# Patient Record
Sex: Female | Born: 1975 | Race: White | Hispanic: No | Marital: Married | Smoking: Never smoker
Health system: Southern US, Community
[De-identification: ages and names within clinical notes are randomized; demographics above are authoritative.]

## PROBLEM LIST (undated history)

## (undated) DIAGNOSIS — Z789 Other specified health status: Secondary | ICD-10-CM

## (undated) DIAGNOSIS — Z8742 Personal history of other diseases of the female genital tract: Secondary | ICD-10-CM

## (undated) DIAGNOSIS — R7611 Nonspecific reaction to tuberculin skin test without active tuberculosis: Secondary | ICD-10-CM

## (undated) HISTORY — DX: Personal history of other diseases of the female genital tract: Z87.42

## (undated) HISTORY — PX: DILATION AND CURETTAGE OF UTERUS: SHX78

## (undated) HISTORY — PX: WISDOM TOOTH EXTRACTION: SHX21

## (undated) HISTORY — PX: AUGMENTATION MAMMAPLASTY: SUR837

---

## 2001-01-20 ENCOUNTER — Other Ambulatory Visit: Admission: RE | Admit: 2001-01-20 | Discharge: 2001-01-20 | Payer: Self-pay | Admitting: Family Medicine

## 2002-01-21 ENCOUNTER — Other Ambulatory Visit: Admission: RE | Admit: 2002-01-21 | Discharge: 2002-01-21 | Payer: Self-pay | Admitting: Family Medicine

## 2004-01-25 ENCOUNTER — Encounter: Admission: RE | Admit: 2004-01-25 | Discharge: 2004-01-25 | Payer: Self-pay | Admitting: Family Medicine

## 2004-04-04 ENCOUNTER — Other Ambulatory Visit: Admission: RE | Admit: 2004-04-04 | Discharge: 2004-04-04 | Payer: Self-pay | Admitting: Obstetrics and Gynecology

## 2004-06-22 ENCOUNTER — Emergency Department (HOSPITAL_COMMUNITY): Admission: EM | Admit: 2004-06-22 | Discharge: 2004-06-23 | Payer: Self-pay | Admitting: Emergency Medicine

## 2005-05-21 ENCOUNTER — Other Ambulatory Visit: Admission: RE | Admit: 2005-05-21 | Discharge: 2005-05-21 | Payer: Self-pay | Admitting: Obstetrics and Gynecology

## 2005-08-24 ENCOUNTER — Ambulatory Visit (HOSPITAL_COMMUNITY): Admission: RE | Admit: 2005-08-24 | Discharge: 2005-08-24 | Payer: Self-pay | Admitting: Obstetrics and Gynecology

## 2006-09-28 ENCOUNTER — Inpatient Hospital Stay (HOSPITAL_COMMUNITY): Admission: AD | Admit: 2006-09-28 | Discharge: 2006-09-28 | Payer: Self-pay | Admitting: Obstetrics and Gynecology

## 2006-12-19 ENCOUNTER — Inpatient Hospital Stay (HOSPITAL_COMMUNITY): Admission: AD | Admit: 2006-12-19 | Discharge: 2006-12-19 | Payer: Self-pay | Admitting: Obstetrics and Gynecology

## 2006-12-20 ENCOUNTER — Encounter (INDEPENDENT_AMBULATORY_CARE_PROVIDER_SITE_OTHER): Payer: Self-pay | Admitting: Obstetrics and Gynecology

## 2006-12-20 ENCOUNTER — Inpatient Hospital Stay (HOSPITAL_COMMUNITY): Admission: AD | Admit: 2006-12-20 | Discharge: 2006-12-22 | Payer: Self-pay | Admitting: Obstetrics and Gynecology

## 2006-12-23 ENCOUNTER — Encounter: Admission: RE | Admit: 2006-12-23 | Discharge: 2007-01-14 | Payer: Self-pay | Admitting: Obstetrics and Gynecology

## 2008-11-06 ENCOUNTER — Inpatient Hospital Stay (HOSPITAL_COMMUNITY): Admission: AD | Admit: 2008-11-06 | Discharge: 2008-11-06 | Payer: Self-pay | Admitting: Obstetrics and Gynecology

## 2009-06-15 ENCOUNTER — Inpatient Hospital Stay (HOSPITAL_COMMUNITY): Admission: AD | Admit: 2009-06-15 | Discharge: 2009-06-15 | Payer: Self-pay | Admitting: Obstetrics and Gynecology

## 2009-06-19 ENCOUNTER — Inpatient Hospital Stay (HOSPITAL_COMMUNITY): Admission: AD | Admit: 2009-06-19 | Discharge: 2009-06-21 | Payer: Self-pay | Admitting: Obstetrics and Gynecology

## 2009-06-20 ENCOUNTER — Encounter (INDEPENDENT_AMBULATORY_CARE_PROVIDER_SITE_OTHER): Payer: Self-pay | Admitting: Obstetrics and Gynecology

## 2010-07-23 NOTE — L&D Delivery Note (Signed)
Delivery Note At 2:10 PM a viable female was delivered via  LOA with 4 pushes  APGAR 9 9  weight pending Placenta status: delivered spontaneously no complications Cord:  with the following complications: none.  Cord pH: not obtained  Anesthesia:  epidural Episiotomy: none Lacerations: none Suture Repair: none Est. Blood Loss (mL): 300  Mom to postpartum.  Baby to nursery-stable.  Samantha Owens L 06/11/2011, 2:25 PM

## 2010-08-13 ENCOUNTER — Encounter: Payer: Self-pay | Admitting: Gynecology

## 2010-10-25 LAB — CBC
HCT: 36.2 % (ref 36.0–46.0)
HCT: 41.6 % (ref 36.0–46.0)
Hemoglobin: 12.4 g/dL (ref 12.0–15.0)
Hemoglobin: 14 g/dL (ref 12.0–15.0)
MCHC: 33.7 g/dL (ref 30.0–36.0)
MCHC: 34.2 g/dL (ref 30.0–36.0)
MCV: 93.8 fL (ref 78.0–100.0)
MCV: 94.1 fL (ref 78.0–100.0)
Platelets: 152 10*3/uL (ref 150–400)
Platelets: 183 10*3/uL (ref 150–400)
RBC: 3.86 MIL/uL — ABNORMAL LOW (ref 3.87–5.11)
RBC: 4.42 MIL/uL (ref 3.87–5.11)
RDW: 12.9 % (ref 11.5–15.5)
RDW: 13 % (ref 11.5–15.5)
WBC: 10.7 10*3/uL — ABNORMAL HIGH (ref 4.0–10.5)
WBC: 13.4 10*3/uL — ABNORMAL HIGH (ref 4.0–10.5)

## 2010-10-25 LAB — RPR: RPR Ser Ql: NONREACTIVE

## 2010-11-09 LAB — GC/CHLAMYDIA PROBE AMP, GENITAL
Chlamydia: NEGATIVE
Gonorrhea: NEGATIVE

## 2010-11-09 LAB — ABO/RH: RH Type: POSITIVE

## 2010-11-09 LAB — HIV ANTIBODY (ROUTINE TESTING W REFLEX): HIV: NONREACTIVE

## 2010-11-25 ENCOUNTER — Inpatient Hospital Stay (HOSPITAL_COMMUNITY)
Admission: AD | Admit: 2010-11-25 | Discharge: 2010-11-25 | Disposition: A | Discharge status: Home / Self Care | Payer: PRIVATE HEALTH INSURANCE | Source: Ambulatory Visit | Attending: Obstetrics and Gynecology | Admitting: Obstetrics and Gynecology

## 2010-11-25 DIAGNOSIS — O209 Hemorrhage in early pregnancy, unspecified: Secondary | ICD-10-CM

## 2010-12-05 NOTE — Op Note (Signed)
Samantha Owens, Samantha Owens               ACCOUNT NO.:  [id number]   MEDICAL RECORD NO.:  [id number]          PATIENT TYPE:  INP   LOCATION:  9162                          FACILITY:  [facility]   PHYSICIAN:  Duke Salvia. Marcelle Overlie, M.D.DATE OF BIRTH:  Jan 23, 1976   DATE OF PROCEDURE:  12/20/2006  DATE OF DISCHARGE:                               OPERATIVE REPORT   DELIVERY NOTE:  The patient was in second stage labor with a scalp lead on, had had low  grade fever to 100, was given Tylenol and IV Unasyn was started early in  the pushing phase.  When the vertex was at OA at +3 station, due to some  variable decelerations that started to have a slower recovery, decision  made to proceed with VE assisted delivery which was explained to the  patient and her husband.  The Kiwi was applied.  One traction effort  coordinated with maternal pushing to effect easy delivery of a female.  Apgars were 9 and 9.  Cord pH was pending.  Placenta was delivered  spontaneously intact, sent to pathology.  Pitocin was given at that  point.  A small second-degree laceration and the left superficial  periurethra were repaired with 3-0 Vicryl Rapide. Estimated blood loss  was 300 mL.  Unasyn IV will be continued for several doses postpartum.  Mother and baby doing well at that point.      Richard M. Marcelle Overlie, M.D.  Electronically Signed     [facility]  D:  12/20/2006  T:  12/20/2006  Job:  [id number]

## 2011-01-17 ENCOUNTER — Other Ambulatory Visit (HOSPITAL_COMMUNITY): Payer: Self-pay | Admitting: Obstetrics and Gynecology

## 2011-01-18 ENCOUNTER — Encounter (HOSPITAL_COMMUNITY): Payer: Self-pay

## 2011-01-18 ENCOUNTER — Other Ambulatory Visit (HOSPITAL_COMMUNITY): Payer: Self-pay | Admitting: Obstetrics and Gynecology

## 2011-01-18 ENCOUNTER — Ambulatory Visit (HOSPITAL_COMMUNITY)
Admission: RE | Admit: 2011-01-18 | Discharge: 2011-01-18 | Disposition: A | Discharge status: Home / Self Care | Payer: PRIVATE HEALTH INSURANCE | Source: Ambulatory Visit | Attending: Obstetrics and Gynecology | Admitting: Obstetrics and Gynecology

## 2011-01-18 DIAGNOSIS — B9789 Other viral agents as the cause of diseases classified elsewhere: Secondary | ICD-10-CM | POA: Insufficient documentation

## 2011-01-18 DIAGNOSIS — O09529 Supervision of elderly multigravida, unspecified trimester: Secondary | ICD-10-CM | POA: Insufficient documentation

## 2011-01-18 DIAGNOSIS — O98519 Other viral diseases complicating pregnancy, unspecified trimester: Secondary | ICD-10-CM | POA: Insufficient documentation

## 2011-01-25 ENCOUNTER — Other Ambulatory Visit (HOSPITAL_COMMUNITY): Payer: Self-pay | Admitting: Obstetrics and Gynecology

## 2011-01-25 ENCOUNTER — Ambulatory Visit (HOSPITAL_COMMUNITY)
Admission: RE | Admit: 2011-01-25 | Discharge: 2011-01-25 | Disposition: A | Discharge status: Home / Self Care | Payer: PRIVATE HEALTH INSURANCE | Source: Ambulatory Visit | Attending: Obstetrics and Gynecology | Admitting: Obstetrics and Gynecology

## 2011-01-25 DIAGNOSIS — B9789 Other viral agents as the cause of diseases classified elsewhere: Secondary | ICD-10-CM | POA: Insufficient documentation

## 2011-01-25 DIAGNOSIS — O98519 Other viral diseases complicating pregnancy, unspecified trimester: Secondary | ICD-10-CM | POA: Insufficient documentation

## 2011-01-25 DIAGNOSIS — O09529 Supervision of elderly multigravida, unspecified trimester: Secondary | ICD-10-CM | POA: Insufficient documentation

## 2011-02-01 ENCOUNTER — Other Ambulatory Visit (HOSPITAL_COMMUNITY): Payer: PRIVATE HEALTH INSURANCE

## 2011-02-02 ENCOUNTER — Ambulatory Visit (HOSPITAL_COMMUNITY)
Admission: RE | Admit: 2011-02-02 | Discharge: 2011-02-02 | Disposition: A | Discharge status: Home / Self Care | Payer: PRIVATE HEALTH INSURANCE | Source: Ambulatory Visit | Attending: Obstetrics and Gynecology | Admitting: Obstetrics and Gynecology

## 2011-02-02 ENCOUNTER — Encounter (HOSPITAL_COMMUNITY): Payer: Self-pay

## 2011-02-02 DIAGNOSIS — O09529 Supervision of elderly multigravida, unspecified trimester: Secondary | ICD-10-CM | POA: Insufficient documentation

## 2011-02-02 DIAGNOSIS — O98519 Other viral diseases complicating pregnancy, unspecified trimester: Secondary | ICD-10-CM | POA: Insufficient documentation

## 2011-02-09 ENCOUNTER — Ambulatory Visit (HOSPITAL_COMMUNITY)
Admission: RE | Admit: 2011-02-09 | Discharge: 2011-02-09 | Disposition: A | Discharge status: Home / Self Care | Payer: PRIVATE HEALTH INSURANCE | Source: Ambulatory Visit | Attending: Obstetrics and Gynecology | Admitting: Obstetrics and Gynecology

## 2011-02-09 DIAGNOSIS — O98519 Other viral diseases complicating pregnancy, unspecified trimester: Secondary | ICD-10-CM | POA: Insufficient documentation

## 2011-02-09 DIAGNOSIS — B9789 Other viral agents as the cause of diseases classified elsewhere: Secondary | ICD-10-CM | POA: Insufficient documentation

## 2011-02-09 DIAGNOSIS — O09529 Supervision of elderly multigravida, unspecified trimester: Secondary | ICD-10-CM | POA: Insufficient documentation

## 2011-02-16 ENCOUNTER — Other Ambulatory Visit (HOSPITAL_COMMUNITY): Payer: PRIVATE HEALTH INSURANCE

## 2011-02-16 ENCOUNTER — Ambulatory Visit (HOSPITAL_COMMUNITY)
Admission: RE | Admit: 2011-02-16 | Discharge: 2011-02-16 | Disposition: A | Discharge status: Home / Self Care | Payer: PRIVATE HEALTH INSURANCE | Source: Ambulatory Visit | Attending: Obstetrics and Gynecology | Admitting: Obstetrics and Gynecology

## 2011-02-16 DIAGNOSIS — B9789 Other viral agents as the cause of diseases classified elsewhere: Secondary | ICD-10-CM | POA: Insufficient documentation

## 2011-02-16 DIAGNOSIS — O98519 Other viral diseases complicating pregnancy, unspecified trimester: Secondary | ICD-10-CM | POA: Insufficient documentation

## 2011-02-16 DIAGNOSIS — O09529 Supervision of elderly multigravida, unspecified trimester: Secondary | ICD-10-CM | POA: Insufficient documentation

## 2011-02-22 ENCOUNTER — Ambulatory Visit (HOSPITAL_COMMUNITY)
Admission: RE | Admit: 2011-02-22 | Discharge: 2011-02-22 | Disposition: A | Discharge status: Home / Self Care | Payer: PRIVATE HEALTH INSURANCE | Source: Ambulatory Visit | Attending: Obstetrics and Gynecology | Admitting: Obstetrics and Gynecology

## 2011-02-22 NOTE — Progress Notes (Signed)
Report in [facility]; follow-up as scheduled

## 2011-02-23 ENCOUNTER — Other Ambulatory Visit (HOSPITAL_COMMUNITY): Payer: PRIVATE HEALTH INSURANCE

## 2011-03-02 ENCOUNTER — Ambulatory Visit (HOSPITAL_COMMUNITY)
Admission: RE | Admit: 2011-03-02 | Discharge: 2011-03-02 | Disposition: A | Discharge status: Home / Self Care | Payer: PRIVATE HEALTH INSURANCE | Source: Ambulatory Visit | Attending: Obstetrics and Gynecology | Admitting: Obstetrics and Gynecology

## 2011-03-02 DIAGNOSIS — B9789 Other viral agents as the cause of diseases classified elsewhere: Secondary | ICD-10-CM | POA: Insufficient documentation

## 2011-03-02 DIAGNOSIS — B343 Parvovirus infection, unspecified: Secondary | ICD-10-CM | POA: Insufficient documentation

## 2011-03-02 DIAGNOSIS — O98519 Other viral diseases complicating pregnancy, unspecified trimester: Secondary | ICD-10-CM | POA: Insufficient documentation

## 2011-03-02 NOTE — Progress Notes (Signed)
Ultrasound in [facility].  Follow up U/S scheduled 1 week

## 2011-03-09 ENCOUNTER — Other Ambulatory Visit (HOSPITAL_COMMUNITY): Payer: Self-pay | Admitting: Obstetrics and Gynecology

## 2011-03-09 ENCOUNTER — Ambulatory Visit (HOSPITAL_COMMUNITY)
Admission: RE | Admit: 2011-03-09 | Discharge: 2011-03-09 | Disposition: A | Discharge status: Home / Self Care | Payer: PRIVATE HEALTH INSURANCE | Source: Ambulatory Visit | Attending: Obstetrics and Gynecology | Admitting: Obstetrics and Gynecology

## 2011-03-09 DIAGNOSIS — B9789 Other viral agents as the cause of diseases classified elsewhere: Secondary | ICD-10-CM | POA: Insufficient documentation

## 2011-03-09 DIAGNOSIS — O98519 Other viral diseases complicating pregnancy, unspecified trimester: Secondary | ICD-10-CM | POA: Insufficient documentation

## 2011-03-09 NOTE — Progress Notes (Signed)
Patient seen for ultrasound only appointment today.  Please see [facility] report for details.  

## 2011-03-16 ENCOUNTER — Other Ambulatory Visit (HOSPITAL_COMMUNITY): Payer: Self-pay | Admitting: Obstetrics and Gynecology

## 2011-03-16 ENCOUNTER — Ambulatory Visit (HOSPITAL_COMMUNITY)
Admission: RE | Admit: 2011-03-16 | Discharge: 2011-03-16 | Disposition: A | Discharge status: Home / Self Care | Payer: PRIVATE HEALTH INSURANCE | Source: Ambulatory Visit | Attending: Obstetrics and Gynecology | Admitting: Obstetrics and Gynecology

## 2011-03-16 DIAGNOSIS — B9789 Other viral agents as the cause of diseases classified elsewhere: Secondary | ICD-10-CM | POA: Insufficient documentation

## 2011-03-16 DIAGNOSIS — O98519 Other viral diseases complicating pregnancy, unspecified trimester: Secondary | ICD-10-CM | POA: Insufficient documentation

## 2011-03-19 ENCOUNTER — Ambulatory Visit (HOSPITAL_COMMUNITY)
Admission: RE | Admit: 2011-03-19 | Discharge: 2011-03-19 | Disposition: A | Discharge status: Home / Self Care | Payer: PRIVATE HEALTH INSURANCE | Source: Ambulatory Visit | Attending: Obstetrics and Gynecology | Admitting: Obstetrics and Gynecology

## 2011-03-19 DIAGNOSIS — B9789 Other viral agents as the cause of diseases classified elsewhere: Secondary | ICD-10-CM | POA: Insufficient documentation

## 2011-03-19 DIAGNOSIS — O98519 Other viral diseases complicating pregnancy, unspecified trimester: Secondary | ICD-10-CM | POA: Insufficient documentation

## 2011-03-19 DIAGNOSIS — O09529 Supervision of elderly multigravida, unspecified trimester: Secondary | ICD-10-CM | POA: Insufficient documentation

## 2011-03-21 DIAGNOSIS — Z3480 Encounter for supervision of other normal pregnancy, unspecified trimester: Secondary | ICD-10-CM

## 2011-03-21 DIAGNOSIS — R112 Nausea with vomiting, unspecified: Secondary | ICD-10-CM

## 2011-03-23 ENCOUNTER — Other Ambulatory Visit (HOSPITAL_COMMUNITY): Payer: Self-pay | Admitting: Obstetrics and Gynecology

## 2011-03-23 ENCOUNTER — Ambulatory Visit (HOSPITAL_COMMUNITY)
Admission: RE | Admit: 2011-03-23 | Discharge: 2011-03-23 | Disposition: A | Discharge status: Home / Self Care | Payer: PRIVATE HEALTH INSURANCE | Source: Ambulatory Visit | Attending: Obstetrics and Gynecology | Admitting: Obstetrics and Gynecology

## 2011-03-23 DIAGNOSIS — B9789 Other viral agents as the cause of diseases classified elsewhere: Secondary | ICD-10-CM | POA: Insufficient documentation

## 2011-03-23 DIAGNOSIS — O98519 Other viral diseases complicating pregnancy, unspecified trimester: Secondary | ICD-10-CM | POA: Insufficient documentation

## 2011-03-23 DIAGNOSIS — O09529 Supervision of elderly multigravida, unspecified trimester: Secondary | ICD-10-CM | POA: Insufficient documentation

## 2011-03-23 NOTE — Progress Notes (Signed)
Ultrasound in [facility].  Follow up U/S scheduled; increased MCA MOM

## 2011-03-28 ENCOUNTER — Ambulatory Visit (HOSPITAL_COMMUNITY)
Admission: RE | Admit: 2011-03-28 | Discharge: 2011-03-28 | Disposition: A | Discharge status: Home / Self Care | Payer: PRIVATE HEALTH INSURANCE | Source: Ambulatory Visit | Attending: Obstetrics and Gynecology | Admitting: Obstetrics and Gynecology

## 2011-03-28 DIAGNOSIS — B9789 Other viral agents as the cause of diseases classified elsewhere: Secondary | ICD-10-CM | POA: Insufficient documentation

## 2011-03-28 DIAGNOSIS — O09529 Supervision of elderly multigravida, unspecified trimester: Secondary | ICD-10-CM | POA: Insufficient documentation

## 2011-03-28 DIAGNOSIS — O98519 Other viral diseases complicating pregnancy, unspecified trimester: Secondary | ICD-10-CM | POA: Insufficient documentation

## 2011-03-30 ENCOUNTER — Other Ambulatory Visit (HOSPITAL_COMMUNITY): Payer: Self-pay | Admitting: Obstetrics and Gynecology

## 2011-03-30 DIAGNOSIS — O09529 Supervision of elderly multigravida, unspecified trimester: Secondary | ICD-10-CM

## 2011-03-30 DIAGNOSIS — O98519 Other viral diseases complicating pregnancy, unspecified trimester: Secondary | ICD-10-CM

## 2011-04-03 ENCOUNTER — Ambulatory Visit (HOSPITAL_COMMUNITY)
Admission: RE | Admit: 2011-04-03 | Discharge: 2011-04-03 | Disposition: A | Discharge status: Home / Self Care | Payer: PRIVATE HEALTH INSURANCE | Source: Ambulatory Visit | Attending: Obstetrics and Gynecology | Admitting: Obstetrics and Gynecology

## 2011-04-03 DIAGNOSIS — O09529 Supervision of elderly multigravida, unspecified trimester: Secondary | ICD-10-CM | POA: Insufficient documentation

## 2011-04-03 DIAGNOSIS — O98519 Other viral diseases complicating pregnancy, unspecified trimester: Secondary | ICD-10-CM

## 2011-04-03 DIAGNOSIS — B9789 Other viral agents as the cause of diseases classified elsewhere: Secondary | ICD-10-CM | POA: Insufficient documentation

## 2011-05-16 ENCOUNTER — Inpatient Hospital Stay (HOSPITAL_COMMUNITY)
Admission: AD | Admit: 2011-05-16 | Discharge: 2011-05-17 | Disposition: A | Discharge status: Home / Self Care | Payer: PRIVATE HEALTH INSURANCE | Source: Ambulatory Visit | Attending: Obstetrics and Gynecology | Admitting: Obstetrics and Gynecology

## 2011-05-16 DIAGNOSIS — R109 Unspecified abdominal pain: Secondary | ICD-10-CM | POA: Insufficient documentation

## 2011-05-16 DIAGNOSIS — O212 Late vomiting of pregnancy: Secondary | ICD-10-CM | POA: Insufficient documentation

## 2011-05-16 DIAGNOSIS — R112 Nausea with vomiting, unspecified: Secondary | ICD-10-CM

## 2011-05-16 HISTORY — DX: Other specified health status: Z78.9

## 2011-05-17 ENCOUNTER — Encounter (HOSPITAL_COMMUNITY): Payer: Self-pay | Admitting: *Deleted

## 2011-05-17 MED ORDER — PROMETHAZINE HCL 25 MG/ML IJ SOLN
25.0000 mg | Freq: Once | INTRAMUSCULAR | Status: AC
  Administered 2011-05-17: 25 mg via INTRAVENOUS
  Filled 2011-05-17: qty 1

## 2011-05-17 MED ORDER — LACTATED RINGERS IV BOLUS (SEPSIS)
1000.0000 mL | Freq: Once | INTRAVENOUS | Status: AC
  Administered 2011-05-17: 1000 mL via INTRAVENOUS

## 2011-05-17 MED ORDER — PROMETHAZINE HCL 25 MG PO TABS: 25.0000 mg | ORAL_TABLET | Freq: Four times a day (QID) | ORAL | Status: AC | PRN

## 2011-05-17 NOTE — Progress Notes (Signed)
Abd pain better after threw up

## 2011-05-17 NOTE — ED Notes (Signed)
0145 Pt vomited

## 2011-05-17 NOTE — Progress Notes (Signed)
Written and verbal d/c instructions given and understanding voiced. 

## 2011-05-17 NOTE — ED Provider Notes (Signed)
History   Pt presents today c/o upper abd pain that began earlier tonight. She states when she arrived at the MAU she vomited and her pain resolved. She states she has a child at home with similar sx. She denies lower abd pain, vag dc, bleeding, fever, or any other sx at this time.  Chief Complaint  Patient presents with  . Labor Eval   HPI  OB History    Grav Para Term Preterm Abortions TAB SAB Ect Mult Living   4 2 2  0 1 1 0 0 0 2      Past Medical History  Diagnosis Date  . No pertinent past medical history     Past Surgical History  Procedure Date  . Dilation and curettage of uterus     No family history on file.  History  Substance Use Topics  . Smoking status: Never Smoker   . Smokeless tobacco: Not on file  . Alcohol Use: No    Allergies:  Allergies  Allergen Reactions  . Erythromycin Nausea And Vomiting  . Avocado Other (See Comments)    stomach    Prescriptions prior to admission  Medication Sig Dispense Refill  . Prenatal MV-Min-Fe Fum-FA-DHA (PRENATAL 1 PO) Take by mouth.        . Simethicone (MAALOX ANTI-GAS PO) Take 10 mLs by mouth daily.          Review of Systems  Constitutional: Negative for fever.  Eyes: Negative for blurred vision.  Cardiovascular: Negative for chest pain.  Gastrointestinal: Positive for nausea, vomiting and abdominal pain. Negative for diarrhea, constipation and blood in stool.  Genitourinary: Negative for dysuria, urgency, frequency, hematuria and flank pain.  Neurological: Negative for dizziness and headaches.  Psychiatric/Behavioral: Negative for depression and suicidal ideas.   Physical Exam   Blood pressure 118/81, pulse 90, temperature 97.9 F (36.6 C), temperature source Oral, resp. rate 18, height 5\' 6"  (1.676 m), weight 178 lb (80.74 kg), SpO2 98.00%.  Physical Exam  Nursing note and vitals reviewed. Constitutional: She is oriented to person, place, and time. She appears well-developed and well-nourished. No  distress.  HENT:  Head: Normocephalic and atraumatic.  Eyes: EOM are normal. Pupils are equal, round, and reactive to light.  GI: Soft. She exhibits no distension. There is no tenderness. There is no rebound and no guarding.  Genitourinary: No bleeding around the vagina. No vaginal discharge found.       Cervix Lg/closed.  Neurological: She is alert and oriented to person, place, and time.  Skin: Skin is warm and dry. She is not diaphoretic.  Psychiatric: She has a normal mood and affect. Her behavior is normal. Judgment and thought content normal.    MAU Course  Procedures  NST reactive.  Pt sx resolved following IV hydration and antiemetics.    Assessment and Plan  N&V: discussed with pt at length. Will give Rx for phenergan. She has f/u scheduled. Discussed diet, activity,risks, and precautions. Discussed signs and sx of labor.  Clinton Gallant. Gevork Ayyad III, DrHSc, MPAS, PA-C  05/17/2011, 1:12 AM   Henrietta Hoover, PA 05/17/11 [phone]

## 2011-05-17 NOTE — Progress Notes (Signed)
Pt reports constant pain x 4 hours, vomited once she arrived here she vomited and pain eased some.pt was seen in office today and was 1 cm , denies bleeding , reports good fetal movement. G4P2

## 2011-05-17 NOTE — Progress Notes (Signed)
G4P2  Pain lower abd since 2000. Nauseated. When came to MAU, vomited 5 times in lobby. No diarrhea. 35yo at home started vomiting tonight.

## 2011-05-17 NOTE — ED Notes (Signed)
Henrietta Hoover PA in to see pt. Pt feeling better

## 2011-05-17 NOTE — Discharge Instructions (Signed)
B.R.A.T. Diet    Your doctor has recommended the B.R.A.T. diet for you or your child until the condition improves. This is often used to help control diarrhea and vomiting symptoms. If you or your child can tolerate clear liquids, you may have:  · Bananas.   · Samantha Owens.   · Applesauce.   · Toast (and other simple starches such as crackers, potatoes, noodles).   Be sure to avoid dairy products, meats, and fatty foods until symptoms are better. Fruit juices such as apple, grape, and prune juice can make diarrhea worse. Avoid these. Continue this diet for 2 days or as instructed by your caregiver.  Document Released: 07/09/2005 Document Revised: 03/21/2011 Document Reviewed: 12/26/2006  [employer]® Patient Information ©2012 [employer].

## 2011-05-17 NOTE — ED Notes (Signed)
0145 Henrietta Hoover PA in to see pt

## 2011-06-03 ENCOUNTER — Inpatient Hospital Stay (HOSPITAL_COMMUNITY)
Admission: AD | Admit: 2011-06-03 | Discharge: 2011-06-03 | Disposition: A | Discharge status: Home / Self Care | Payer: PRIVATE HEALTH INSURANCE | Source: Ambulatory Visit | Attending: Obstetrics and Gynecology | Admitting: Obstetrics and Gynecology

## 2011-06-03 ENCOUNTER — Encounter (HOSPITAL_COMMUNITY): Payer: Self-pay | Admitting: *Deleted

## 2011-06-03 DIAGNOSIS — O479 False labor, unspecified: Secondary | ICD-10-CM | POA: Insufficient documentation

## 2011-06-03 DIAGNOSIS — B343 Parvovirus infection, unspecified: Secondary | ICD-10-CM

## 2011-06-03 NOTE — Progress Notes (Signed)
Pt c/o contractions off and on throughout the day; no regular ctx but pt states they are stronger with more pressure in my pelvis.

## 2011-06-07 ENCOUNTER — Telehealth (HOSPITAL_COMMUNITY): Payer: Self-pay | Admitting: *Deleted

## 2011-06-07 ENCOUNTER — Encounter (HOSPITAL_COMMUNITY): Payer: Self-pay | Admitting: *Deleted

## 2011-06-07 NOTE — Telephone Encounter (Signed)
Preadmission screen  

## 2011-06-11 ENCOUNTER — Inpatient Hospital Stay (HOSPITAL_COMMUNITY)
Admission: AD | Admit: 2011-06-11 | Discharge: 2011-06-13 | DRG: 775 | Disposition: A | Discharge status: Home / Self Care | Payer: PRIVATE HEALTH INSURANCE | Source: Ambulatory Visit | Attending: Obstetrics and Gynecology | Admitting: Obstetrics and Gynecology

## 2011-06-11 ENCOUNTER — Inpatient Hospital Stay (HOSPITAL_COMMUNITY): Payer: PRIVATE HEALTH INSURANCE | Admitting: Anesthesiology

## 2011-06-11 ENCOUNTER — Encounter (HOSPITAL_COMMUNITY): Payer: Self-pay | Admitting: *Deleted

## 2011-06-11 ENCOUNTER — Encounter (HOSPITAL_COMMUNITY): Payer: Self-pay | Admitting: Anesthesiology

## 2011-06-11 DIAGNOSIS — O09529 Supervision of elderly multigravida, unspecified trimester: Principal | ICD-10-CM | POA: Diagnosis present

## 2011-06-11 DIAGNOSIS — B343 Parvovirus infection, unspecified: Secondary | ICD-10-CM

## 2011-06-11 HISTORY — DX: Nonspecific reaction to tuberculin skin test without active tuberculosis: R76.11

## 2011-06-11 LAB — CBC
HCT: 41 % (ref 36.0–46.0)
Hemoglobin: 14.1 g/dL (ref 12.0–15.0)
MCH: 31.4 pg (ref 26.0–34.0)
MCHC: 34.4 g/dL (ref 30.0–36.0)
MCV: 91.3 fL (ref 78.0–100.0)
Platelets: 145 K/uL — ABNORMAL LOW (ref 150–400)
RBC: 4.49 MIL/uL (ref 3.87–5.11)
RDW: 13.3 % (ref 11.5–15.5)
WBC: 8.5 K/uL (ref 4.0–10.5)

## 2011-06-11 LAB — RUBELLA SCREEN: Rubella: >500 IU/mL — ABNORMAL HIGH

## 2011-06-11 MED ORDER — FLEET ENEMA 7-19 GM/118ML RE ENEM: 1.0000 | ENEMA | RECTAL | Status: DC | PRN

## 2011-06-11 MED ORDER — MEDROXYPROGESTERONE ACETATE 150 MG/ML IM SUSP: 150.0000 mg | INTRAMUSCULAR | Status: DC | PRN

## 2011-06-11 MED ORDER — BENZOCAINE-MENTHOL 20-0.5 % EX AERO
1.0000 "application " | INHALATION_SPRAY | CUTANEOUS | Status: DC | PRN
  Administered 2011-06-12: 1 via TOPICAL

## 2011-06-11 MED ORDER — DIBUCAINE 1 % RE OINT: 1.0000 "application " | TOPICAL_OINTMENT | RECTAL | Status: DC | PRN

## 2011-06-11 MED ORDER — SIMETHICONE 80 MG PO CHEW: 80.0000 mg | CHEWABLE_TABLET | ORAL | Status: DC | PRN

## 2011-06-11 MED ORDER — ZOLPIDEM TARTRATE 5 MG PO TABS: 5.0000 mg | ORAL_TABLET | Freq: Every evening | ORAL | Status: DC | PRN

## 2011-06-11 MED ORDER — ONDANSETRON HCL 4 MG/2ML IJ SOLN: 4.0000 mg | INTRAMUSCULAR | Status: DC | PRN

## 2011-06-11 MED ORDER — TETANUS-DIPHTH-ACELL PERTUSSIS 5-2.5-18.5 LF-MCG/0.5 IM SUSP: 0.5000 mL | Freq: Once | INTRAMUSCULAR | Status: DC

## 2011-06-11 MED ORDER — ACETAMINOPHEN 325 MG PO TABS: 650.0000 mg | ORAL_TABLET | ORAL | Status: DC | PRN

## 2011-06-11 MED ORDER — LIDOCAINE HCL 1.5 % IJ SOLN
INTRAMUSCULAR | Status: DC | PRN
  Administered 2011-06-11 (×2): 5 mL via EPIDURAL

## 2011-06-11 MED ORDER — PHENYLEPHRINE 40 MCG/ML (10ML) SYRINGE FOR IV PUSH (FOR BLOOD PRESSURE SUPPORT)
80.0000 ug | PREFILLED_SYRINGE | INTRAVENOUS | Status: DC | PRN
  Filled 2011-06-11: qty 5

## 2011-06-11 MED ORDER — CITRIC ACID-SODIUM CITRATE 334-500 MG/5ML PO SOLN: 30.0000 mL | ORAL | Status: DC | PRN

## 2011-06-11 MED ORDER — SENNOSIDES-DOCUSATE SODIUM 8.6-50 MG PO TABS
2.0000 | ORAL_TABLET | Freq: Every day | ORAL | Status: DC
  Administered 2011-06-11 – 2011-06-12 (×2): 2 via ORAL

## 2011-06-11 MED ORDER — ONDANSETRON HCL 4 MG PO TABS: 4.0000 mg | ORAL_TABLET | ORAL | Status: DC | PRN

## 2011-06-11 MED ORDER — EPHEDRINE 5 MG/ML INJ
10.0000 mg | INTRAVENOUS | Status: DC | PRN
  Filled 2011-06-11: qty 4

## 2011-06-11 MED ORDER — DIPHENHYDRAMINE HCL 25 MG PO CAPS: 25.0000 mg | ORAL_CAPSULE | Freq: Four times a day (QID) | ORAL | Status: DC | PRN

## 2011-06-11 MED ORDER — MEASLES, MUMPS & RUBELLA VAC ~~LOC~~ INJ: 0.5000 mL | INJECTION | Freq: Once | SUBCUTANEOUS | Status: DC

## 2011-06-11 MED ORDER — WITCH HAZEL-GLYCERIN EX PADS: 1.0000 "application " | MEDICATED_PAD | CUTANEOUS | Status: DC | PRN

## 2011-06-11 MED ORDER — OXYTOCIN BOLUS FROM INFUSION
500.0000 mL | Freq: Once | INTRAVENOUS | Status: AC
  Administered 2011-06-11: 500 mL via INTRAVENOUS
  Filled 2011-06-11: qty 500

## 2011-06-11 MED ORDER — PRENATAL PLUS 27-1 MG PO TABS
1.0000 | ORAL_TABLET | Freq: Every day | ORAL | Status: DC
  Administered 2011-06-12 – 2011-06-13 (×2): 1 via ORAL
  Filled 2011-06-11 (×2): qty 1

## 2011-06-11 MED ORDER — LANOLIN HYDROUS EX OINT: TOPICAL_OINTMENT | CUTANEOUS | Status: DC | PRN

## 2011-06-11 MED ORDER — LACTATED RINGERS IV SOLN
INTRAVENOUS | Status: DC
  Administered 2011-06-11: 12:00:00 via INTRAVENOUS
  Administered 2011-06-11: 125 mL/h via INTRAVENOUS

## 2011-06-11 MED ORDER — BISACODYL 10 MG RE SUPP: 10.0000 mg | Freq: Every day | RECTAL | Status: DC | PRN

## 2011-06-11 MED ORDER — IBUPROFEN 600 MG PO TABS
600.0000 mg | ORAL_TABLET | Freq: Four times a day (QID) | ORAL | Status: DC | PRN
  Filled 2011-06-11: qty 1

## 2011-06-11 MED ORDER — IBUPROFEN 600 MG PO TABS
600.0000 mg | ORAL_TABLET | Freq: Four times a day (QID) | ORAL | Status: DC
  Administered 2011-06-11 – 2011-06-13 (×8): 600 mg via ORAL
  Filled 2011-06-11 (×7): qty 1

## 2011-06-11 MED ORDER — OXYCODONE-ACETAMINOPHEN 5-325 MG PO TABS: 2.0000 | ORAL_TABLET | ORAL | Status: DC | PRN

## 2011-06-11 MED ORDER — EPHEDRINE 5 MG/ML INJ: 10.0000 mg | INTRAVENOUS | Status: DC | PRN

## 2011-06-11 MED ORDER — ONDANSETRON HCL 4 MG/2ML IJ SOLN: 4.0000 mg | Freq: Four times a day (QID) | INTRAMUSCULAR | Status: DC | PRN

## 2011-06-11 MED ORDER — PHENYLEPHRINE 40 MCG/ML (10ML) SYRINGE FOR IV PUSH (FOR BLOOD PRESSURE SUPPORT): 80.0000 ug | PREFILLED_SYRINGE | INTRAVENOUS | Status: DC | PRN

## 2011-06-11 MED ORDER — FENTANYL 2.5 MCG/ML BUPIVACAINE 1/10 % EPIDURAL INFUSION (WH - ANES)
14.0000 mL/h | INTRAMUSCULAR | Status: DC
  Administered 2011-06-11: 14 mL/h via EPIDURAL
  Filled 2011-06-11: qty 60

## 2011-06-11 MED ORDER — LIDOCAINE HCL (PF) 1 % IJ SOLN
30.0000 mL | INTRAMUSCULAR | Status: DC | PRN
  Filled 2011-06-11: qty 30

## 2011-06-11 MED ORDER — OXYCODONE-ACETAMINOPHEN 5-325 MG PO TABS
1.0000 | ORAL_TABLET | ORAL | Status: DC | PRN
  Administered 2011-06-11: 1 via ORAL
  Filled 2011-06-11: qty 1

## 2011-06-11 MED ORDER — LACTATED RINGERS IV SOLN
500.0000 mL | Freq: Once | INTRAVENOUS | Status: AC
  Administered 2011-06-11: 500 mL via INTRAVENOUS

## 2011-06-11 MED ORDER — OXYTOCIN 20 UNITS IN LACTATED RINGERS INFUSION - SIMPLE
125.0000 mL/h | Freq: Once | INTRAVENOUS | Status: AC
  Administered 2011-06-11: 125 mL/h via INTRAVENOUS
  Filled 2011-06-11: qty 1000

## 2011-06-11 MED ORDER — LACTATED RINGERS IV SOLN: 500.0000 mL | INTRAVENOUS | Status: DC | PRN

## 2011-06-11 MED ORDER — DIPHENHYDRAMINE HCL 50 MG/ML IJ SOLN: 12.5000 mg | INTRAMUSCULAR | Status: DC | PRN

## 2011-06-11 NOTE — Progress Notes (Signed)
Patient states she is having contractions every 2-8 minutes and very uncomfortable. Reports good fetal movement, no bleeding or leaking.

## 2011-06-11 NOTE — Anesthesia Procedure Notes (Signed)

## 2011-06-11 NOTE — Anesthesia Preprocedure Evaluation (Signed)

## 2011-06-11 NOTE — Plan of Care (Signed)
Problem: Consults Goal: Birthing Suites Patient Information Press F2 to bring up selections list   Pt > [redacted] weeks EGA     

## 2011-06-11 NOTE — Progress Notes (Signed)
Pt in for labor eval, reports ucs every 3-7 minutes since 0945.  Denies any bleeding or gushes of fluid.  Reports increased discharge this am.  Reports good fetal movement.

## 2011-06-11 NOTE — H&P (Signed)
35 year old G 4 P 2012 at 26 w 3 days presents in active labor and SROM this am. Prenatal Care is uncomplicated. NSVD x 2 GBBS is negative  Afebrile Vital Signs Stable General alert and oriented Lung CTAB Car RRR Abdomen is soft and non tender Cervix is c/5/+2 at 12 45  vertex  Imp: Labor  Plan : Anticipate NSVD

## 2011-06-12 LAB — CBC
Hemoglobin: 12.9 g/dL (ref 12.0–15.0)
MCHC: 33.7 g/dL (ref 30.0–36.0)
RBC: 4.18 MIL/uL (ref 3.87–5.11)
WBC: 8.9 10*3/uL (ref 4.0–10.5)

## 2011-06-12 MED ORDER — BENZOCAINE-MENTHOL 20-0.5 % EX AERO
INHALATION_SPRAY | CUTANEOUS | Status: AC
  Administered 2011-06-12: 18:00:00
  Filled 2011-06-12: qty 56

## 2011-06-12 NOTE — Progress Notes (Signed)
Post Partum Day 1 Subjective: no complaints, up ad lib and voiding  Objective: Blood pressure 110/68, pulse 74, temperature 97.9 F (36.6 C), temperature source Oral, resp. rate 16, height 5\' 6"  (1.676 m), weight 82.101 kg (181 lb), SpO2 98.00%, unknown if currently breastfeeding.  Physical Exam:  General: alert and cooperative Lochia: appropriate Uterine Fundus: firm Perineum intact }DVT Evaluation: No evidence of DVT seen on physical exam.   Basename 06/12/11 0510 06/11/11 1144  HGB 12.9 14.1  HCT 38.3 41.0    Assessment/Plan: Plan for discharge tomorrow   LOS: 1 day   CURTIS,CAROL G 06/12/2011, 8:09 AM

## 2011-06-12 NOTE — Anesthesia Postprocedure Evaluation (Signed)
  Anesthesia Post-op Note  Patient: Samantha Owens  Procedure(s) Performed: * No procedures listed *  Patient Location: PACU and Mother/Baby  Anesthesia Type: Epidural  Level of Consciousness: awake, alert  and oriented  Airway and Oxygen Therapy: Patient Spontanous Breathing  Post-op Pain: none  Post-op Assessment: Post-op Vital signs reviewed and Patient's Cardiovascular Status Stable  Post-op Vital Signs: Reviewed and stable  Complications: No apparent anesthesia complications

## 2011-06-13 ENCOUNTER — Inpatient Hospital Stay (HOSPITAL_COMMUNITY): Admission: RE | Admit: 2011-06-13 | Payer: PRIVATE HEALTH INSURANCE | Source: Ambulatory Visit

## 2011-06-13 MED ORDER — IBUPROFEN 600 MG PO TABS: 600.0000 mg | ORAL_TABLET | Freq: Four times a day (QID) | ORAL | Status: AC

## 2011-06-13 MED ORDER — BENZOCAINE-MENTHOL 20-0.5 % EX AERO: 1.0000 "application " | INHALATION_SPRAY | CUTANEOUS | Status: AC | PRN

## 2011-06-13 MED ORDER — OXYCODONE-ACETAMINOPHEN 5-325 MG PO TABS: 1.0000 | ORAL_TABLET | ORAL | Status: AC | PRN

## 2011-06-13 NOTE — Discharge Summary (Signed)
Obstetric Discharge Summary Reason for Admission: onset of labor Prenatal Procedures: ultrasound Intrapartum Procedures: spontaneous vaginal delivery Postpartum Procedures: none Complications-Operative and Postpartum: none Hemoglobin  Date Value Range Status  06/12/2011 12.9  12.0-15.0 (g/dL) Final     HCT  Date Value Range Status  06/12/2011 38.3  36.0-46.0 (%) Final    Discharge Diagnoses: Term Pregnancy-delivered  Discharge Information: Date: 06/13/2011 Activity: pelvic rest Diet: routine Medications: PNV, Ibuprofen and Percocet Condition: stable Instructions: refer to practice specific booklet Discharge to: home Follow-up Information    Follow up in 6 weeks.         Newborn Data: Live born female  Birth Weight: 6 lb 10.7 oz (3025 g) APGAR: 8, 9  Home with mother.  Samantha Owens,[name] E 06/13/2011, 10:56 AM

## 2011-06-13 NOTE — Progress Notes (Signed)
No C/o Decreasing lochia  Blood pressure 107/71, pulse 79, temperature 97.6 F (36.4 C), temperature source Oral, resp. rate 18, height 5\' 6"  (1.676 m), weight 82.101 kg (181 lb), SpO2 95.00%, unknown if currently breastfeeding.  FFNT  Satisfactory D/C home  Instructions given

## 2011-08-16 ENCOUNTER — Other Ambulatory Visit: Payer: Self-pay | Admitting: Dermatology

## 2013-12-15 ENCOUNTER — Other Ambulatory Visit: Payer: Self-pay

## 2014-05-24 ENCOUNTER — Encounter (HOSPITAL_COMMUNITY): Payer: Self-pay | Admitting: *Deleted

## 2014-06-22 ENCOUNTER — Other Ambulatory Visit: Payer: Self-pay | Admitting: Obstetrics and Gynecology

## 2014-06-23 LAB — CYTOLOGY - PAP

## 2017-12-11 ENCOUNTER — Other Ambulatory Visit: Payer: Self-pay | Admitting: Internal Medicine

## 2017-12-11 DIAGNOSIS — N644 Mastodynia: Secondary | ICD-10-CM

## 2017-12-13 ENCOUNTER — Ambulatory Visit
Admission: RE | Admit: 2017-12-13 | Discharge: 2017-12-13 | Disposition: A | Discharge status: Home / Self Care | Payer: PRIVATE HEALTH INSURANCE | Source: Ambulatory Visit | Attending: Internal Medicine | Admitting: Internal Medicine

## 2017-12-13 ENCOUNTER — Other Ambulatory Visit: Payer: Self-pay | Admitting: Internal Medicine

## 2017-12-13 DIAGNOSIS — N644 Mastodynia: Secondary | ICD-10-CM

## 2019-07-27 ENCOUNTER — Ambulatory Visit: Payer: PRIVATE HEALTH INSURANCE | Attending: Internal Medicine

## 2019-07-27 ENCOUNTER — Ambulatory Visit: Payer: Self-pay | Attending: Internal Medicine

## 2019-07-27 DIAGNOSIS — Z20822 Contact with and (suspected) exposure to covid-19: Secondary | ICD-10-CM | POA: Insufficient documentation

## 2019-07-27 DIAGNOSIS — U071 COVID-19: Secondary | ICD-10-CM

## 2019-07-27 DIAGNOSIS — R238 Other skin changes: Secondary | ICD-10-CM | POA: Insufficient documentation

## 2019-07-28 LAB — NOVEL CORONAVIRUS, NAA: SARS-CoV-2, NAA: NOT DETECTED

## 2019-08-31 ENCOUNTER — Ambulatory Visit: Payer: PRIVATE HEALTH INSURANCE | Attending: Internal Medicine

## 2019-08-31 DIAGNOSIS — Z23 Encounter for immunization: Secondary | ICD-10-CM

## 2019-08-31 NOTE — Progress Notes (Signed)
   Covid-19 Vaccination Clinic  Name:  Samantha Owens    MRN: [id number] DOB: 08-Jan-1976  08/31/2019  Samantha Owens was observed post Covid-19 immunization for 15 minutes without incidence. She was provided with Vaccine Information Sheet and instruction to access the V-Safe system.   Samantha Owens was instructed to call 911 with any severe reactions post vaccine: Marland Kitchen Difficulty breathing  . Swelling of your face and throat  . A fast heartbeat  . A bad rash all over your body  . Dizziness and weakness    Immunizations Administered    Name Date Dose VIS Date Route   Pfizer COVID-19 Vaccine 08/31/2019  6:19 PM 0.3 mL 07/03/2019 Intramuscular   Manufacturer: [employer], [employer]   Lot: [id number]   NDC: [address]-0

## 2019-09-25 ENCOUNTER — Ambulatory Visit: Payer: Self-pay | Attending: Internal Medicine

## 2019-09-25 DIAGNOSIS — Z23 Encounter for immunization: Secondary | ICD-10-CM | POA: Insufficient documentation

## 2019-09-25 NOTE — Progress Notes (Signed)
   Covid-19 Vaccination Clinic  Name:  TENESSA MARSEE    MRN: [id number] DOB: 1976-07-01  09/25/2019  Ms. Hokenson was observed post Covid-19 immunization for 15 minutes without incident. She was provided with Vaccine Information Sheet and instruction to access the V-Safe system.   Ms. Burling was instructed to call 911 with any severe reactions post vaccine: Marland Kitchen Difficulty breathing  . Swelling of face and throat  . A fast heartbeat  . A bad rash all over body  . Dizziness and weakness   Immunizations Administered    Name Date Dose VIS Date Route   Pfizer COVID-19 Vaccine 09/25/2019  4:50 PM 0.3 mL 07/03/2019 Intramuscular   Manufacturer: [employer], [employer]   Lot: [id number]   NDC: [address]-6

## 2022-10-11 ENCOUNTER — Other Ambulatory Visit: Payer: Self-pay | Admitting: Obstetrics and Gynecology

## 2022-10-11 DIAGNOSIS — R928 Other abnormal and inconclusive findings on diagnostic imaging of breast: Secondary | ICD-10-CM

## 2022-10-25 ENCOUNTER — Other Ambulatory Visit: Payer: Self-pay

## 2022-11-01 ENCOUNTER — Other Ambulatory Visit: Payer: Self-pay | Admitting: Obstetrics and Gynecology

## 2022-11-01 ENCOUNTER — Ambulatory Visit
Admission: RE | Admit: 2022-11-01 | Discharge: 2022-11-01 | Disposition: A | Discharge status: Home / Self Care | Payer: Commercial Managed Care - HMO | Source: Ambulatory Visit | Attending: Obstetrics and Gynecology | Admitting: Obstetrics and Gynecology

## 2022-11-01 DIAGNOSIS — R928 Other abnormal and inconclusive findings on diagnostic imaging of breast: Secondary | ICD-10-CM

## 2022-11-01 DIAGNOSIS — N6310 Unspecified lump in the right breast, unspecified quadrant: Secondary | ICD-10-CM

## 2022-11-02 ENCOUNTER — Ambulatory Visit
Admission: RE | Admit: 2022-11-02 | Discharge: 2022-11-02 | Disposition: A | Discharge status: Home / Self Care | Payer: Commercial Managed Care - HMO | Source: Ambulatory Visit | Attending: Obstetrics and Gynecology | Admitting: Obstetrics and Gynecology

## 2022-11-02 DIAGNOSIS — R928 Other abnormal and inconclusive findings on diagnostic imaging of breast: Secondary | ICD-10-CM

## 2022-11-02 DIAGNOSIS — N6310 Unspecified lump in the right breast, unspecified quadrant: Secondary | ICD-10-CM

## 2022-11-02 HISTORY — PX: BREAST BIOPSY: SHX20

## 2022-11-05 ENCOUNTER — Inpatient Hospital Stay: Admission: RE | Admit: 2022-11-05 | Payer: Self-pay | Source: Ambulatory Visit

## 2023-03-26 ENCOUNTER — Other Ambulatory Visit: Payer: Self-pay | Admitting: Obstetrics and Gynecology

## 2023-03-26 DIAGNOSIS — N63 Unspecified lump in unspecified breast: Secondary | ICD-10-CM

## 2023-05-06 ENCOUNTER — Ambulatory Visit
Admission: RE | Admit: 2023-05-06 | Discharge: 2023-05-06 | Disposition: A | Discharge status: Home / Self Care | Payer: Commercial Managed Care - HMO | Source: Ambulatory Visit | Attending: Obstetrics and Gynecology | Admitting: Obstetrics and Gynecology

## 2023-05-06 ENCOUNTER — Other Ambulatory Visit: Payer: Self-pay | Admitting: Obstetrics and Gynecology

## 2023-05-06 DIAGNOSIS — N63 Unspecified lump in unspecified breast: Secondary | ICD-10-CM

## 2023-05-07 ENCOUNTER — Other Ambulatory Visit: Payer: Self-pay | Admitting: Obstetrics and Gynecology

## 2023-05-07 DIAGNOSIS — N6310 Unspecified lump in the right breast, unspecified quadrant: Secondary | ICD-10-CM

## 2023-05-07 DIAGNOSIS — N6489 Other specified disorders of breast: Secondary | ICD-10-CM

## 2023-07-02 ENCOUNTER — Ambulatory Visit: Payer: Commercial Managed Care - HMO | Admitting: Family Medicine

## 2023-10-11 ENCOUNTER — Other Ambulatory Visit: Payer: Commercial Managed Care - HMO

## 2023-10-16 ENCOUNTER — Other Ambulatory Visit

## 2023-10-16 ENCOUNTER — Encounter

## 2023-11-06 ENCOUNTER — Encounter

## 2023-11-06 ENCOUNTER — Other Ambulatory Visit

## 2023-11-14 ENCOUNTER — Ambulatory Visit
Admission: RE | Admit: 2023-11-14 | Discharge: 2023-11-14 | Disposition: A | Discharge status: Home / Self Care | Source: Ambulatory Visit | Attending: Obstetrics and Gynecology | Admitting: Obstetrics and Gynecology

## 2023-11-14 ENCOUNTER — Other Ambulatory Visit: Payer: Self-pay | Admitting: Obstetrics and Gynecology

## 2023-11-14 DIAGNOSIS — N6310 Unspecified lump in the right breast, unspecified quadrant: Secondary | ICD-10-CM

## 2023-11-14 DIAGNOSIS — R921 Mammographic calcification found on diagnostic imaging of breast: Secondary | ICD-10-CM

## 2023-11-14 DIAGNOSIS — N6489 Other specified disorders of breast: Secondary | ICD-10-CM

## 2024-05-18 ENCOUNTER — Ambulatory Visit
Admission: RE | Admit: 2024-05-18 | Discharge: 2024-05-18 | Disposition: A | Discharge status: Home / Self Care | Source: Ambulatory Visit | Attending: Obstetrics and Gynecology | Admitting: Obstetrics and Gynecology

## 2024-05-18 DIAGNOSIS — R921 Mammographic calcification found on diagnostic imaging of breast: Secondary | ICD-10-CM
